# Patient Record
Sex: Female | Born: 2000 | Race: Black or African American | Hispanic: No | Marital: Single | Smoking: Never smoker
Health system: Southern US, Community
[De-identification: ages and names within clinical notes are randomized; demographics above are authoritative.]

---
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
## 2018-01-04 ENCOUNTER — Telehealth: Payer: Self-pay | Admitting: Nurse Practitioner

## 2018-01-04 NOTE — Telephone Encounter (Unsigned)
Copied from CRM [phone]. Topic: Quick Communication - See Telephone Encounter >> Jan 04, 2018  2:56 PM Raquel SarnaHayes, Teresa G wrote: Pt needs a tetanus shot for college.  Please order shot and schedule appt.

## 2018-01-04 NOTE — Telephone Encounter (Signed)
Appointment scheduled.

## 2018-01-04 NOTE — Telephone Encounter (Signed)
Yes she just needs a nurse visit.

## 2018-01-04 NOTE — Telephone Encounter (Signed)
Is it okay to schedule appointment for injection?

## 2018-01-07 ENCOUNTER — Ambulatory Visit (INDEPENDENT_AMBULATORY_CARE_PROVIDER_SITE_OTHER): Payer: BC Managed Care – PPO | Admitting: *Deleted

## 2018-01-07 ENCOUNTER — Encounter: Payer: Self-pay | Admitting: Obstetrics & Gynecology

## 2018-01-07 DIAGNOSIS — Z23 Encounter for immunization: Secondary | ICD-10-CM | POA: Diagnosis not present

## 2018-01-08 ENCOUNTER — Ambulatory Visit (INDEPENDENT_AMBULATORY_CARE_PROVIDER_SITE_OTHER): Payer: BC Managed Care – PPO | Admitting: Anesthesiology

## 2018-01-08 DIAGNOSIS — Z23 Encounter for immunization: Secondary | ICD-10-CM

## 2018-10-22 ENCOUNTER — Encounter: Payer: Self-pay | Admitting: Internal Medicine

## 2018-10-22 ENCOUNTER — Ambulatory Visit (INDEPENDENT_AMBULATORY_CARE_PROVIDER_SITE_OTHER): Payer: BC Managed Care – PPO | Admitting: Internal Medicine

## 2018-10-22 ENCOUNTER — Other Ambulatory Visit: Payer: Self-pay

## 2018-10-22 ENCOUNTER — Telehealth: Payer: Self-pay | Admitting: Nurse Practitioner

## 2018-10-22 DIAGNOSIS — J301 Allergic rhinitis due to pollen: Secondary | ICD-10-CM | POA: Diagnosis not present

## 2018-10-22 DIAGNOSIS — J309 Allergic rhinitis, unspecified: Secondary | ICD-10-CM | POA: Insufficient documentation

## 2018-10-22 MED ORDER — OLOPATADINE HCL 0.2 % OP SOLN: 1.0000 [drp] | Freq: Every day | OPHTHALMIC | 0 refills | Status: AC

## 2018-10-22 MED ORDER — FLUTICASONE PROPIONATE 50 MCG/ACT NA SUSP: 2.0000 | Freq: Every day | NASAL | 6 refills | Status: DC

## 2018-10-22 NOTE — Progress Notes (Signed)
Virtual Visit via Video Note  I connected with Sabrina Huber on 10/22/18 at  1:40 PM EDT by a video enabled telemedicine application and verified that I am speaking with the correct person using two identifiers.   I discussed the limitations of evaluation and management by telemedicine and the availability of in person appointments. The patient expressed understanding and agreed to proceed.  History of Present Illness: The patient is a 18 y.o. YO female with visit for allergy symptoms including itchy throat and nose and eyes. Started about 1 week or so ago. Typically gets seasonal allergy symptoms and used to take zyrtec which stopped working so she switched to [employer]. This worked well for a couple of weeks but stopped working last week. Having eye itchiness and puffiness. Some nose drainage clear. Denies ear pain or sinus pressure/pain. Denies fevers or chills or body aches. Has still been taking allegra. Denies SOB or cough. Overall it is stable since onset. LMP within last month exact date unknown  Observations/Objective: Appearance: normal, breathing appears normal, normal grooming, abdomen does not appear distended, throat normal, mental status is A and O times 3  Assessment and Plan: See problem oriented charting  Follow Up Instructions: Rx for flonase and pataday eye drops, call back if not improved  I discussed the assessment and treatment plan with the patient. The patient was provided an opportunity to ask questions and all were answered. The patient agreed with the plan and demonstrated an understanding of the instructions.   The patient was advised to call back or seek an in-person evaluation if the symptoms worsen or if the condition fails to improve as anticipated.  Myrlene Broker, MD

## 2018-10-22 NOTE — Telephone Encounter (Signed)
Copied from CRM [id number]. Topic: Quick Communication - Rx Refill/Question >> Oct 22, 2018 12:07 PM Baldo Daub L wrote: Medication:  Allergy pill  Pt called and left message on [facility] mailbox.  States that OTC allergy medication is not working and she would like doctor to call something in for her.  Pt states she can be reached at [phone].

## 2018-10-22 NOTE — Assessment & Plan Note (Signed)
Rx for flonase and pataday for symptoms. No suspicion for covid-19 and discussed signs/symptoms to watch for and to maintain social distancing and importance although she does not have symptoms at this time.

## 2018-10-22 NOTE — Telephone Encounter (Signed)
Patient scheduled with Okey Dupre.

## 2018-10-22 NOTE — Telephone Encounter (Signed)
Please advise 

## 2019-03-13 ENCOUNTER — Other Ambulatory Visit: Payer: Self-pay

## 2019-03-13 DIAGNOSIS — Z20822 Contact with and (suspected) exposure to covid-19: Secondary | ICD-10-CM

## 2019-03-15 LAB — NOVEL CORONAVIRUS, NAA: SARS-CoV-2, NAA: NOT DETECTED

## 2019-04-14 ENCOUNTER — Encounter: Payer: Self-pay | Admitting: Gynecology

## 2019-06-11 ENCOUNTER — Other Ambulatory Visit: Payer: BC Managed Care – PPO

## 2019-06-11 ENCOUNTER — Other Ambulatory Visit: Payer: Self-pay

## 2019-06-11 ENCOUNTER — Ambulatory Visit (INDEPENDENT_AMBULATORY_CARE_PROVIDER_SITE_OTHER): Payer: BC Managed Care – PPO | Admitting: Internal Medicine

## 2019-06-11 ENCOUNTER — Encounter: Payer: Self-pay | Admitting: Internal Medicine

## 2019-06-11 VITALS — BP 118/78 | HR 84 | Temp 99.2°F | Ht 66.06 in | Wt 235.0 lb

## 2019-06-11 DIAGNOSIS — Z202 Contact with and (suspected) exposure to infections with a predominantly sexual mode of transmission: Secondary | ICD-10-CM

## 2019-06-11 DIAGNOSIS — F3281 Premenstrual dysphoric disorder: Secondary | ICD-10-CM | POA: Diagnosis not present

## 2019-06-11 NOTE — Patient Instructions (Signed)

## 2019-06-11 NOTE — Progress Notes (Signed)
   Subjective:   Patient ID: Sabrina Huber, female    DOB: [birth year], 18 y.o.   MRN: [id number]  HPI The patient is a 18 YO female coming in for concerns about PMS symptoms including bad cramping, bleeding for 1-2 weeks, irregular periods, mood disturbance. This has been going on in the last year or so. She would like to be checked for STDs. No recent exposure but concerns about previous partner. She has not been on birth control except for depo-provera which caused her to stop having periods for months after only 1 dose and she did not have that again. She does have a gynecologist and sees them regularly.   Review of Systems  Constitutional: Negative.   HENT: Negative.   Eyes: Negative.   Respiratory: Negative for cough, chest tightness and shortness of breath.   Cardiovascular: Negative for chest pain, palpitations and leg swelling.  Gastrointestinal: Negative for abdominal distention, abdominal pain, constipation, diarrhea, nausea and vomiting.  Genitourinary: Positive for menstrual problem.  Musculoskeletal: Negative.   Skin: Negative.   Neurological: Negative.   Psychiatric/Behavioral: Negative.     Objective:  Physical Exam Constitutional:      Appearance: She is well-developed.  HENT:     Head: Normocephalic and atraumatic.  Neck:     Musculoskeletal: Normal range of motion.  Cardiovascular:     Rate and Rhythm: Normal rate and regular rhythm.  Pulmonary:     Effort: Pulmonary effort is normal. No respiratory distress.     Breath sounds: Normal breath sounds. No wheezing or rales.  Abdominal:     General: Bowel sounds are normal. There is no distension.     Palpations: Abdomen is soft.     Tenderness: There is no abdominal tenderness. There is no rebound.  Skin:    General: Skin is warm and dry.  Neurological:     Mental Status: She is alert and oriented to person, place, and time.     Coordination: Coordination normal.     Vitals:   06/11/19 0821  BP: 118/78   Pulse: 84  Temp: 99.2 F (37.3 C)  TempSrc: Oral  SpO2: 99%  Weight: 235 lb (106.6 kg)  Height: 5' 6.06" (1.678 m)    Assessment & Plan:

## 2019-06-12 DIAGNOSIS — F3281 Premenstrual dysphoric disorder: Secondary | ICD-10-CM | POA: Insufficient documentation

## 2019-06-12 LAB — HIV ANTIBODY (ROUTINE TESTING W REFLEX): HIV 1&2 Ab, 4th Generation: NONREACTIVE

## 2019-06-12 NOTE — Assessment & Plan Note (Signed)
Checking for STDs, counseled about birth control options. Advised her to see her gyn given the change in bleeding to ensure no problems.

## 2019-06-15 LAB — GC/CHLAMYDIA PROBE AMP
Chlamydia trachomatis, NAA: NEGATIVE
Neisseria Gonorrhoeae by PCR: NEGATIVE

## 2019-06-19 ENCOUNTER — Encounter: Payer: Self-pay | Admitting: Internal Medicine

## 2019-08-21 ENCOUNTER — Ambulatory Visit: Payer: BC Managed Care – PPO | Attending: Internal Medicine

## 2019-08-21 DIAGNOSIS — Z20822 Contact with and (suspected) exposure to covid-19: Secondary | ICD-10-CM

## 2019-08-22 LAB — NOVEL CORONAVIRUS, NAA: SARS-CoV-2, NAA: NOT DETECTED

## 2019-10-30 ENCOUNTER — Ambulatory Visit: Payer: BC Managed Care – PPO

## 2019-12-26 ENCOUNTER — Telehealth (INDEPENDENT_AMBULATORY_CARE_PROVIDER_SITE_OTHER): Payer: BC Managed Care – PPO | Admitting: Physician Assistant

## 2019-12-26 ENCOUNTER — Encounter: Payer: Self-pay | Admitting: Physician Assistant

## 2019-12-26 DIAGNOSIS — R0981 Nasal congestion: Secondary | ICD-10-CM

## 2019-12-26 MED ORDER — AMOXICILLIN-POT CLAVULANATE 875-125 MG PO TABS
1.0000 | ORAL_TABLET | Freq: Two times a day (BID) | ORAL | 0 refills | Status: DC
Start: 2019-12-26 — End: 2021-02-23

## 2019-12-26 MED ORDER — FLUTICASONE PROPIONATE 50 MCG/ACT NA SUSP: 2.0000 | Freq: Every day | NASAL | 6 refills | Status: DC

## 2019-12-26 NOTE — Progress Notes (Signed)
Virtual Visit via Video   I connected with Sabrina Huber on 12/26/19 at 12:30 PM EDT by a video enabled telemedicine application and verified that I am speaking with the correct person using two identifiers. Location patient: Home Location provider: [facility], Office Persons participating in the virtual visit: Sibyl Addelynn, Batte PA-C.  I discussed the limitations of evaluation and management by telemedicine and the availability of in person appointments. The patient expressed understanding and agreed to proceed.   Subjective:   HPI:   Sinus problem Reports sinus congestion x 3 days. Mainly frontal. Has history of allergic rhinitis and sinusitis -- typically gets a sinus infection this time, yearly.  Has not tried anything other than OTC flonase for symptoms. Doesn't take regular antihistamines because she feels like they do not work for her.  Denies: fevers, chills, cough, SOB, chest pain  ROS: See pertinent positives and negatives per HPI.  Patient Active Problem List   Diagnosis Date Noted  . PMDD (premenstrual dysphoric disorder) 06/12/2019  . Allergic rhinitis 10/22/2018  . Encounter for general adult medical examination with abnormal findings 08/29/2017    Social History   Tobacco Use  . Smoking status: Never Smoker  . Smokeless tobacco: Never Used  Substance Use Topics  . Alcohol use: No    Current Outpatient Medications:  .  amoxicillin-clavulanate (AUGMENTIN) 875-125 MG tablet, Take 1 tablet by mouth 2 (two) times daily., Disp: 20 tablet, Rfl: 0 .  fluticasone (FLONASE) 50 MCG/ACT nasal spray, Place 2 sprays into both nostrils daily., Disp: 16 g, Rfl: 6 .  Olopatadine HCl 0.2 % SOLN, Apply 1-2 drops to eye daily., Disp: 5 mL, Rfl: 0  No Known Allergies  Objective:   VITALS: Per patient if applicable, see vitals. GENERAL: Alert, appears well and in no acute distress. HEENT: Atraumatic, conjunctiva clear, no obvious abnormalities on  inspection of external nose and ears. NECK: Normal movements of the head and neck. CARDIOPULMONARY: No increased WOB. Speaking in clear sentences. I:E ratio WNL.  MS: Moves all visible extremities without noticeable abnormality. PSYCH: Pleasant and cooperative, well-groomed. Speech normal rate and rhythm. Affect is appropriate. Insight and judgement are appropriate. Attention is focused, linear, and appropriate.  NEURO: CN grossly intact. Oriented as arrived to appointment on time with no prompting. Moves both UE equally.  SKIN: No obvious lesions, wounds, erythema, or cyanosis noted on face or hands.  Assessment and Plan:   Sabrina Huber was seen today for sinus problem.  Diagnoses and all orders for this visit:  Sinus congestion No red flags on discussion exam. Suspect early sinusitis. Recommend flonase use and antihistamine of choice. Did provide pocket rx of oral augmentin should symptoms persist despite 7-10 days or worsen. Discussed taking medications as prescribed. Reviewed return precautions including worsening fever, SOB, worsening cough or other concerns. Push fluids and rest. I recommend that patient follow-up if symptoms worsen or persist despite treatment x 7-10 days, sooner if needed.  Other orders -     amoxicillin-clavulanate (AUGMENTIN) 875-125 MG tablet; Take 1 tablet by mouth 2 (two) times daily. -     fluticasone (FLONASE) 50 MCG/ACT nasal spray; Place 2 sprays into both nostrils daily.  . Reviewed expectations re: course of current medical issues. . Discussed self-management of symptoms. . Outlined signs and symptoms indicating need for more acute intervention. . Patient verbalized understanding and all questions were answered. Marland Kitchen Health Maintenance issues including appropriate healthy diet, exercise, and smoking avoidance were discussed with patient. . See orders  for this visit as documented in the electronic medical record.  I discussed the assessment and treatment plan  with the patient. The patient was provided an opportunity to ask questions and all were answered. The patient agreed with the plan and demonstrated an understanding of the instructions.   The patient was advised to call back or seek an in-person evaluation if the symptoms worsen or if the condition fails to improve as anticipated.  [place], [place] 12/26/2019

## 2020-08-31 ENCOUNTER — Encounter: Payer: Self-pay | Admitting: Family Medicine

## 2020-08-31 ENCOUNTER — Telehealth: Payer: 59 | Admitting: Family Medicine

## 2020-08-31 DIAGNOSIS — B373 Candidiasis of vulva and vagina: Secondary | ICD-10-CM

## 2020-08-31 DIAGNOSIS — B3731 Acute candidiasis of vulva and vagina: Secondary | ICD-10-CM

## 2020-08-31 MED ORDER — FLUCONAZOLE 150 MG PO TABS: 150.0000 mg | ORAL_TABLET | Freq: Every day | ORAL | 0 refills | Status: DC

## 2020-08-31 NOTE — Patient Instructions (Signed)
I appreciate the opportunity to provide you with care for your health and wellness.  Follow up: w PCP if not improved  Please continue to practice social distancing to keep you, your family, and our community safe.  If you must go out, please wear a mask and practice good handwashing.   Vaginal Yeast Infection, Adult  Vaginal yeast infection is a condition that causes vaginal discharge as well as soreness, swelling, and redness (inflammation) of the vagina. This is a common condition. Some women get this infection frequently. What are the causes? This condition is caused by a change in the normal balance of the yeast (candida) and bacteria that live in the vagina. This change causes an overgrowth of yeast, which causes the inflammation. What increases the risk? The condition is more likely to develop in women who:  Take antibiotic medicines.  Have diabetes.  Take birth control pills.  Are pregnant.  Douche often.  Have a weak body defense system (immune system).  Have been taking steroid medicines for a long time.  Frequently wear tight clothing. What are the signs or symptoms? Symptoms of this condition include:  White, thick, creamy vaginal discharge.  Swelling, itching, redness, and irritation of the vagina. The lips of the vagina (vulva) may be affected as well.  Pain or a burning feeling while urinating.  Pain during sex. How is this diagnosed? This condition is diagnosed based on:  Your medical history.  A physical exam.  A pelvic exam. Your health care provider will examine a sample of your vaginal discharge under a microscope. Your health care provider may send this sample for testing to confirm the diagnosis. How is this treated? This condition is treated with medicine. Medicines may be over-the-counter or prescription. You may be told to use one or more of the following:  Medicine that is taken by mouth (orally).  Medicine that is applied as a cream  (topically).  Medicine that is inserted directly into the vagina (suppository). Follow these instructions at home: Lifestyle  Do not have sex until your health care provider approves. Tell your sex partner that you have a yeast infection. That person should go to his or her health care provider and ask if they should also be treated.  Do not wear tight clothes, such as pantyhose or tight pants.  Wear breathable cotton underwear. General instructions  Take or apply over-the-counter and prescription medicines only as told by your health care provider.  Eat more yogurt. This may help to keep your yeast infection from returning.  Do not use tampons until your health care provider approves.  Try taking a sitz bath to help with discomfort. This is a warm water bath that is taken while you are sitting down. The water should only come up to your hips and should cover your buttocks. Do this 3-4 times per day or as told by your health care provider.  Do not douche.  If you have diabetes, keep your blood sugar levels under control.  Keep all follow-up visits as told by your health care provider. This is important.   Contact a health care provider if:  You have a fever.  Your symptoms go away and then return.  Your symptoms do not get better with treatment.  Your symptoms get worse.  You have new symptoms.  You develop blisters in or around your vagina.  You have blood coming from your vagina and it is not your menstrual period.  You develop pain in your abdomen.  Summary  Vaginal yeast infection is a condition that causes discharge as well as soreness, swelling, and redness (inflammation) of the vagina.  This condition is treated with medicine. Medicines may be over-the-counter or prescription.  Take or apply over-the-counter and prescription medicines only as told by your health care provider.  Do not douche. Do not have sex or use tampons until your health care provider  approves.  Contact a health care provider if your symptoms do not get better with treatment or your symptoms go away and then return. This information is not intended to replace advice given to you by your health care provider. Make sure you discuss any questions you have with your health care provider. Document Revised: 02/07/2019 Document Reviewed: 11/26/2017 Elsevier Patient Education  2021 [employer].

## 2020-08-31 NOTE — Progress Notes (Signed)
Ms. fusae, florio are scheduled for a virtual visit with your provider today.    Just as we do with appointments in the office, we must obtain your consent to participate.  Your consent will be active for this visit and any virtual visit you may have with one of our providers in the next 365 days.    If you have a MyChart account, I can also send a copy of this consent to you electronically.  All virtual visits are billed to your insurance company just like a traditional visit in the office.  As this is a virtual visit, video technology does not allow for your provider to perform a traditional examination.  This may limit your provider's ability to fully assess your condition.  If your provider identifies any concerns that need to be evaluated in person or the need to arrange testing such as labs, EKG, etc, we will make arrangements to do so.    Although advances in technology are sophisticated, we cannot ensure that it will always work on either your end or our end.  If the connection with a video visit is poor, we may have to switch to a telephone visit.  With either a video or telephone visit, we are not always able to ensure that we have a secure connection.   I need to obtain your verbal consent now.   Are you willing to proceed with your visit today?   Aala Ransom has provided verbal consent on 08/31/2020 for a virtual visit (video or telephone).   Freddy Finner, NP 08/31/2020  8:45 AM   Date:  08/31/2020   ID:  Roosvelt Maser, DOB 02/04/2001, MRN [id number]  Patient Location: Home Provider Location: Home Office   Participants: Patient and Provider for Visit and Wrap up  Method of visit: Video  Location of Patient: Home Location of Provider: Home Office Consent was obtain for visit over the video. Services rendered by provider: Visit was performed via video  A video enabled telemedicine application was used and I verified that I am speaking with the correct person using two  identifiers.  PCP:  Evaristo Bury, NP   Chief Complaint:  Vaginal itching   History of Present Illness:    Shantele Reller is a 20 y.o. female with history as stated below. Presents video telehealth for an acute care visit due to vaginal itching. She reports over a week of increase vaginal itching. She feels her skin is tender and irritated. She has white clumpy discharge which she reports to be a yeast like infection- has had one in the past. Has tried monistat without relief. She thinks it is related to a new body wash, as it did not start until after that. She denies recent intercourse in last 2-4 weeks or exposure to STI's  She denies having any other issues or concerns.  Past Medical, Surgical, Social History, Allergies, and Medications have been Reviewed.  No past medical history on file.  Current Meds  Medication Sig  . fluconazole (DIFLUCAN) 150 MG tablet Take 1 tablet (150 mg total) by mouth daily.     Allergies:   Patient has no known allergies.   ROS See HPI for history of present illness.  Physical Exam Constitutional:      Appearance: Normal appearance.  HENT:     Head: Normocephalic and atraumatic.     Right Ear: External ear normal.     Left Ear: External ear normal.     Nose: Nose normal.  Eyes:     Extraocular Movements: Extraocular movements intact.     Conjunctiva/sclera: Conjunctivae normal.     Pupils: Pupils are equal, round, and reactive to light.  Pulmonary:     Comments: No shortness of breath in conversation  Musculoskeletal:        General: Normal range of motion.     Cervical back: Normal range of motion.  Skin:    Coloration: Skin is not jaundiced or pale.  Neurological:     Mental Status: She is alert and oriented to person, place, and time.  Psychiatric:        Mood and Affect: Mood normal.        Behavior: Behavior normal.        Thought Content: Thought content normal.        Judgment: Judgment normal.              1.  Vaginal yeast infection S&S are consistent with a yeast infection, however I did mention BV being a concern. She reports if treatment does not help she will call PCP to have an in person appt.  -Provided with Diflucan x 1 -Encouraged to avoid the body wash, bubble bathes and other hygiene products to that area -She is encourage to eat yogurt to provide healthy bacteria growth -Additional info on AVS   - fluconazole (DIFLUCAN) 150 MG tablet; Take 1 tablet (150 mg total) by mouth daily.  Dispense: 1 tablet; Refill: 0    Time:   Today, I have spent 10 minutes with the patient with telehealth technology discussing the above problems, reviewing the chart, previous notes, medications and orders.    Tests Ordered: No orders of the defined types were placed in this encounter.   Medication Changes: Meds ordered this encounter  Medications  . fluconazole (DIFLUCAN) 150 MG tablet    Sig: Take 1 tablet (150 mg total) by mouth daily.    Dispense:  1 tablet    Refill:  0    Order Specific Question:   Supervising Provider    Answer:   Kerri Perches [[id number]]     Disposition:  Follow up w PCP Signed, Freddy Finner, NP  08/31/2020 8:45 AM

## 2021-02-23 ENCOUNTER — Encounter (HOSPITAL_COMMUNITY): Payer: Self-pay

## 2021-02-23 ENCOUNTER — Ambulatory Visit (HOSPITAL_COMMUNITY)
Admission: EM | Admit: 2021-02-23 | Discharge: 2021-02-23 | Disposition: A | Discharge status: Home / Self Care | Payer: 59 | Attending: Medical Oncology | Admitting: Medical Oncology

## 2021-02-23 ENCOUNTER — Other Ambulatory Visit: Payer: Self-pay

## 2021-02-23 DIAGNOSIS — J301 Allergic rhinitis due to pollen: Secondary | ICD-10-CM

## 2021-02-23 MED ORDER — FLUTICASONE PROPIONATE 50 MCG/ACT NA SUSP: 2.0000 | Freq: Every day | NASAL | 0 refills | Status: AC

## 2021-02-23 NOTE — ED Triage Notes (Signed)
Pt reports on and off nasal congestion x 5 days.

## 2021-02-23 NOTE — ED Provider Notes (Signed)
[facility]    CSN: [id number] Arrival date & time: 02/23/21  1521      History   Chief Complaint Chief Complaint  Patient presents with   Nasal Congestion    HPI Sabrina Huber is a 20 y.o. female.   HPI  Nasal Congestion: Patient reports that about once a year she gets seasonal allergies.  Seasonal allergies comprised of a stuffy and runny nose.  She has had symptoms for the last few days.  She reports that normally she takes Flonase but has been unable to get into see her PCP so she arrives today.  She denies any fevers, cough, sore throat, vomiting or diarrhea.  She has tried Mucinex but this did not help much.  No known sick contacts.  History reviewed. No pertinent past medical history.  Patient Active Problem List   Diagnosis Date Noted   PMDD (premenstrual dysphoric disorder) 06/12/2019   Allergic rhinitis 10/22/2018   Encounter for general adult medical examination with abnormal findings 08/29/2017    History reviewed. No pertinent surgical history.  OB History     Gravida  0   Para  0   Term  0   Preterm  0   AB  0   Living  0      SAB  0   IAB  0   Ectopic  0   Multiple  0   Live Births  0            Home Medications    Prior to Admission medications   Medication Sig Start Date End Date Taking? Authorizing Provider  amoxicillin-clavulanate (AUGMENTIN) 875-125 MG tablet Take 1 tablet by mouth 2 (two) times daily. 12/26/19   Jarold Motto, PA  fluconazole (DIFLUCAN) 150 MG tablet Take 1 tablet (150 mg total) by mouth daily. 08/31/20   Freddy Finner, NP  fluticasone (FLONASE) 50 MCG/ACT nasal spray Place 2 sprays into both nostrils daily. 12/26/19   Jarold Motto, PA  Olopatadine HCl 0.2 % SOLN Apply 1-2 drops to eye daily. 10/22/18   Myrlene Broker, MD    Family History Family History  Problem Relation Age of Onset   Hyperlipidemia Maternal Uncle     Social History Social History   Tobacco Use   Smoking  status: Never   Smokeless tobacco: Never  Vaping Use   Vaping Use: Never used  Substance Use Topics   Alcohol use: No   Drug use: No     Allergies   Patient has no known allergies.   Review of Systems Review of Systems  As stated above in HPI Physical Exam Triage Vital Signs ED Triage Vitals  Enc Vitals Group     BP 02/23/21 1740 124/75     Pulse Rate 02/23/21 1740 83     Resp 02/23/21 1740 18     Temp 02/23/21 1740 98.7 F (37.1 C)     Temp Source 02/23/21 1740 Oral     SpO2 02/23/21 1740 100 %     Weight --      Height --      Head Circumference --      Peak Flow --      Pain Score 02/23/21 1739 0     Pain Loc --      Pain Edu? --      Excl. in GC? --    No data found.  Updated Vital Signs BP 124/75 (BP Location: Right Arm)   Pulse 83  Temp 98.7 F (37.1 C) (Oral)   Resp 18   SpO2 100%   Physical Exam Vitals and nursing note reviewed.  Constitutional:      General: She is not in acute distress.    Appearance: Normal appearance. She is not ill-appearing, toxic-appearing or diaphoretic.  HENT:     Head: Normocephalic and atraumatic.     Right Ear: Tympanic membrane normal.     Left Ear: Tympanic membrane normal.     Nose: Congestion and rhinorrhea present.     Mouth/Throat:     Mouth: Mucous membranes are moist.     Pharynx: Oropharynx is clear. No oropharyngeal exudate or posterior oropharyngeal erythema.  Eyes:     Extraocular Movements: Extraocular movements intact.     Pupils: Pupils are equal, round, and reactive to light.  Cardiovascular:     Rate and Rhythm: Normal rate and regular rhythm.     Heart sounds: Normal heart sounds.  Pulmonary:     Effort: Pulmonary effort is normal.     Breath sounds: Normal breath sounds.  Musculoskeletal:     Cervical back: Neck supple.  Lymphadenopathy:     Cervical: No cervical adenopathy.  Skin:    General: Skin is warm.  Neurological:     Mental Status: She is alert and oriented to person, place,  and time.     UC Treatments / Results  Labs (all labs ordered are listed, but only abnormal results are displayed) Labs Reviewed - No data to display  EKG   Radiology No results found.  Procedures Procedures (including critical care time)  Medications Ordered in UC Medications - No data to display  Initial Impression / Assessment and Plan / UC Course  I have reviewed the triage vital signs and the nursing notes.  Pertinent labs & imaging results that were available during my care of the patient were reviewed by me and considered in my medical decision making (see chart for details).     New.  Sending in Flonase to help with her allergic rhinitis.  Follow-up as needed. Final Clinical Impressions(s) / UC Diagnoses   Final diagnoses:  None   Discharge Instructions   None    ED Prescriptions   None    PDMP not reviewed this encounter.   Rushie Chestnut, [place] 02/23/21 1752

## 2021-03-03 ENCOUNTER — Ambulatory Visit: Payer: 59 | Admitting: Physician Assistant

## 2021-05-31 ENCOUNTER — Other Ambulatory Visit: Payer: Self-pay

## 2021-05-31 ENCOUNTER — Encounter: Payer: Self-pay | Admitting: Internal Medicine

## 2021-05-31 ENCOUNTER — Ambulatory Visit: Payer: 59 | Admitting: Internal Medicine

## 2021-05-31 VITALS — BP 118/76 | HR 71 | Temp 98.7°F | Ht 66.0 in | Wt 169.0 lb

## 2021-05-31 DIAGNOSIS — Z23 Encounter for immunization: Secondary | ICD-10-CM | POA: Diagnosis not present

## 2021-05-31 DIAGNOSIS — Z124 Encounter for screening for malignant neoplasm of cervix: Secondary | ICD-10-CM

## 2021-05-31 DIAGNOSIS — R634 Abnormal weight loss: Secondary | ICD-10-CM | POA: Diagnosis not present

## 2021-05-31 DIAGNOSIS — F320 Major depressive disorder, single episode, mild: Secondary | ICD-10-CM

## 2021-05-31 DIAGNOSIS — Z0001 Encounter for general adult medical examination with abnormal findings: Secondary | ICD-10-CM

## 2021-05-31 LAB — CBC WITH DIFFERENTIAL/PLATELET
Basophils Absolute: 0 10*3/uL (ref 0.0–0.1)
Basophils Relative: 1.2 % (ref 0.0–3.0)
Eosinophils Absolute: 0.1 10*3/uL (ref 0.0–0.7)
Eosinophils Relative: 1.3 % (ref 0.0–5.0)
HCT: 37.5 % (ref 36.0–46.0)
Hemoglobin: 12.4 g/dL (ref 12.0–15.0)
Lymphocytes Relative: 26.4 % (ref 12.0–46.0)
Lymphs Abs: 1.1 10*3/uL (ref 0.7–4.0)
MCHC: 33 g/dL (ref 30.0–36.0)
MCV: 94.7 fl (ref 78.0–100.0)
Monocytes Absolute: 0.4 10*3/uL (ref 0.1–1.0)
Monocytes Relative: 10.2 % (ref 3.0–12.0)
Neutro Abs: 2.5 10*3/uL (ref 1.4–7.7)
Neutrophils Relative %: 60.9 % (ref 43.0–77.0)
Platelets: 237 10*3/uL (ref 150.0–400.0)
RBC: 3.96 Mil/uL (ref 3.87–5.11)
RDW: 13.1 % (ref 11.5–15.5)
WBC: 4.2 10*3/uL (ref 4.0–10.5)

## 2021-05-31 LAB — BASIC METABOLIC PANEL
BUN: 10 mg/dL (ref 6–23)
CO2: 25 mEq/L (ref 19–32)
Calcium: 9 mg/dL (ref 8.4–10.5)
Chloride: 106 mEq/L (ref 96–112)
Creatinine, Ser: 0.73 mg/dL (ref 0.40–1.20)
GFR: 117.11 mL/min (ref >60.00)
Glucose, Bld: 84 mg/dL (ref 70–99)
Potassium: 3.7 mEq/L (ref 3.5–5.1)
Sodium: 139 mEq/L (ref 135–145)

## 2021-05-31 LAB — HEPATIC FUNCTION PANEL
ALT: 8 U/L (ref 0–35)
AST: 14 U/L (ref 0–37)
Albumin: 4.1 g/dL (ref 3.5–5.2)
Alkaline Phosphatase: 33 U/L — ABNORMAL LOW (ref 39–117)
Bilirubin, Direct: 0.1 mg/dL (ref 0.0–0.3)
Total Bilirubin: 0.4 mg/dL (ref 0.2–1.2)
Total Protein: 7 g/dL (ref 6.0–8.3)
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
## 2021-05-31 NOTE — Patient Instructions (Signed)
[facility] Lorenda Cahill [phone]  Major Depressive Disorder, Adult Major depressive disorder (MDD) is a mental health condition. It may also be called clinical depression or unipolar depression. MDD causes symptoms of sadness, hopelessness, and loss of interest in things. These symptoms last most of the day, almost every day, for 2 weeks. MDD can also cause physical symptoms. It can interfere with relationships and with everyday activities, such as work, school, and activities that are usually pleasant. MDD may be mild, moderate, or severe. It may be single-episode MDD, which happens once, or recurrent MDD, which may occur multiple times. What are the causes? The exact cause of this condition is not known. MDD is most likely caused by a combination of things, which may include: Your personality traits. Learned or conditioned behaviors or thoughts or feelings that reinforce negativity. Any alcohol or substance misuse. Long-term (chronic) physical or mental health illness. Going through a traumatic experience or major life changes. What increases the risk? The following factors may make someone more likely to develop MDD: A family history of depression. Being a woman. Troubled family relationships. Abnormally low levels of certain brain chemicals. Traumatic or painful events in childhood, especially abuse or loss of a parent. A lot of stress from life experiences, such as poor living conditions or discrimination. Chronic physical illness or other mental health disorders. What are the signs or symptoms? The main symptoms of MDD usually include: Constant depressed or irritable mood. A loss of interest in things and activities. Other symptoms include: Sleeping or eating too much or too little. Unexplained weight gain or weight loss. Tiredness or low energy. Being agitated, restless, or weak. Feeling hopeless, worthless, or guilty. Trouble thinking clearly or making  decisions. Thoughts of suicide or thoughts of harming others. Isolating oneself or avoiding other people or activities. Trouble completing tasks, work, or any normal obligations. Severe symptoms of this condition may include: Psychotic depression.This may include false beliefs, or delusions. It may also include seeing, hearing, tasting, smelling, or feeling things that are not real (hallucinations). Chronic depression or persistent depressive disorder. This is low-level depression that lasts for at least 2 years. Melancholic depression, or feeling extremely sad and hopeless. Catatonic depression, which includes trouble speaking and trouble moving. How is this diagnosed? This condition may be diagnosed based on: Your symptoms. Your medical and mental health history. You may be asked questions about your lifestyle, including any drug and alcohol use. A physical exam. Blood tests to rule out other conditions. MDD is confirmed if you have the following symptoms most of the day, nearly every day, in a 2-week period: Either a depressed mood or loss of interest. At least four other MDD symptoms. How is this treated? This condition is usually treated by mental health professionals, such as psychologists, psychiatrists, and clinical social workers. You may need more than one type of treatment. Treatment may include: Psychotherapy, also called talk therapy or counseling. Types of psychotherapy include: Cognitive behavioral therapy (CBT). This teaches you to recognize unhealthy feelings, thoughts, and behaviors, and replace them with positive thoughts and actions. Interpersonal therapy (IPT). This helps you to improve the way you communicate with others or relate to them. Family therapy. This treatment includes members of your family. Medicines to treat anxiety and depression. These medicines help to balance the brain chemicals that affect your emotions. Lifestyle changes. You may be asked to: Limit  alcohol use and avoid drug use. Get regular exercise. Get plenty of sleep. Make healthy eating choices. Spend  Spend more time outdoors. ?Brain stimulation. This may be done if symptoms are very severe and other treatments have not worked. Examples of this treatment are electroconvulsive therapy and transcranial magnetic stimulation. ?Follow these instructions at home: ?Activity ?Exercise regularly and spend time outdoors. ?Find activities that you enjoy doing, and make time to do them. ?Find healthy ways to manage stress, such as: ?Meditation or deep breathing. ?Spending time in nature. ?Journaling. ?Return to your normal activities as told by your health care provider. Ask your health care provider what activities are safe for you. ?Alcohol and drug use ?If you drink alcohol: ?Limit how much you use to: ?0-1 drink a day for women who are not pregnant. ?0-2 drinks a day for men. ?Be aware of how much alcohol is in your drink. In the [place] one drink equals one 12 oz bottle of beer (355 mL), one 5 oz glass of wine (148 mL), or one 1? oz glass of hard liquor (44 mL). ?Discuss your alcohol use with your health care provider. Alcohol can affect any antidepressant medicines you are taking. ?Discuss any drug use with your health care provider. ?General instructions ? ?Take over-the-counter and prescription medicines only as told by your health care provider. ?Eat a healthy diet and get plenty of sleep. ?Consider joining a support group. Your health care provider may be able to recommend one. ?Keep all follow-up visits as told by your health care provider. This is important. ?Where to find more information ?National Alliance on Mental Illness: www.nami.org ?[place] National Institute of Mental Health: www.nimh.nih.gov ?Contact a health care provider if: ?Your symptoms get worse. ?You develop new symptoms. ?Get help right away if: ?You self-harm. ?You have serious thoughts about hurting yourself or others. ?You hallucinate. ?If  you ever feel like you may hurt yourself or others, or have thoughts about taking your own life, get help right away. Go to your nearest emergency department or: ?Call your local emergency services (911 in the [place] ?Call a suicide crisis helpline, such as the National Suicide Prevention Lifeline at 1-800-273-8255 or 988 in the [place] This is open 24 hours a day in the [place] ?Text the Crisis Text Line at 741741 (in the [place] ?Summary ?Major depressive disorder (MDD) is a mental health condition. MDD causes symptoms of sadness, hopelessness, and loss of interest in things. These symptoms last most of the day, almost every day, for 2 weeks. ?The symptoms of MDD can interfere with relationships and with everyday activities. ?Treatments and support are available for people who develop MDD. You may need more than one type of treatment. ?Get help right away if you have serious thoughts about hurting yourself or others. ?This information is not intended to replace advice given to you by your health care provider. Make sure you discuss any questions you have with your health care provider. ?Document Revised: 02/02/2021 Document Reviewed: 06/21/2019 ?Elsevier Patient Education ? 2022 Elsevier Inc. ? ?

## 2021-06-01 LAB — THYROID PANEL WITH TSH
Free Thyroxine Index: 1.6 (ref 1.4–3.8)
T3 Uptake: 27 % (ref 22–35)
T4, Total: 6.1 ug/dL (ref 5.1–11.9)
TSH: 0.86 mIU/L

## 2021-06-01 LAB — HIV ANTIBODY (ROUTINE TESTING W REFLEX): HIV 1&2 Ab, 4th Generation: NONREACTIVE

## 2021-06-03 ENCOUNTER — Encounter: Payer: Self-pay | Admitting: Internal Medicine

## 2021-06-30 ENCOUNTER — Encounter: Payer: Self-pay | Admitting: Internal Medicine

## 2021-06-30 ENCOUNTER — Ambulatory Visit (INDEPENDENT_AMBULATORY_CARE_PROVIDER_SITE_OTHER): Payer: 59 | Admitting: Internal Medicine

## 2021-06-30 ENCOUNTER — Ambulatory Visit (INDEPENDENT_AMBULATORY_CARE_PROVIDER_SITE_OTHER): Payer: 59

## 2021-06-30 ENCOUNTER — Other Ambulatory Visit: Payer: Self-pay

## 2021-06-30 VITALS — BP 116/78 | HR 77 | Temp 98.2°F | Resp 16 | Ht 66.0 in | Wt 169.0 lb

## 2021-06-30 DIAGNOSIS — J22 Unspecified acute lower respiratory infection: Secondary | ICD-10-CM

## 2021-06-30 DIAGNOSIS — R058 Other specified cough: Secondary | ICD-10-CM

## 2021-06-30 LAB — POC COVID19 BINAXNOW: SARS Coronavirus 2 Ag: NEGATIVE

## 2021-06-30 MED ORDER — PROMETHAZINE-DM 6.25-15 MG/5ML PO SYRP
5.0000 mL | ORAL_SOLUTION | Freq: Four times a day (QID) | ORAL | 0 refills | Status: AC | PRN
Start: 2021-06-30 — End: 2021-07-07

## 2021-06-30 MED ORDER — AZITHROMYCIN 500 MG PO TABS
500.0000 mg | ORAL_TABLET | Freq: Every day | ORAL | 0 refills | Status: AC
Start: 2021-06-30 — End: 2021-07-03

## 2021-06-30 NOTE — Progress Notes (Signed)
Subjective:  Patient ID: Sabrina Huber, female    DOB: 11-May-2001  Age: 20 y.o. MRN: [id number]  CC: Cough  This visit occurred during the SARS-CoV-2 public health emergency.  Safety protocols were in place, including screening questions prior to the visit, additional usage of staff PPE, and extensive cleaning of exam room while observing appropriate contact time as indicated for disinfecting solutions.    HPI Sabrina Huber presents for f/up -   She complains of a 2-day history of sore throat and cough productive of thick yellow phlegm.  She denies odynophagia, lymphadenopathy, rash, fever, chills, shortness of breath, or night sweats.  History Sabrina Huber has no past medical history on file.   She has no past surgical history on file.   Her family history includes Breast cancer in her mother; Hyperlipidemia in her maternal uncle; Thyroid disease in her father.She reports that she has never smoked. She has never used smokeless tobacco. She reports current alcohol use of about 1.0 standard drink per week. She reports that she does not use drugs.  Outpatient Medications Prior to Visit  Medication Sig Dispense Refill   fluticasone (FLONASE) 50 MCG/ACT nasal spray Place 2 sprays into both nostrils daily. 16 g 0   Olopatadine HCl 0.2 % SOLN Apply 1-2 drops to eye daily. 5 mL 0   No facility-administered medications prior to visit.    ROS Review of Systems  Constitutional:  Negative for chills, diaphoresis, fatigue and fever.  HENT:  Positive for sore throat.   Eyes: Negative.   Respiratory:  Positive for cough. Negative for chest tightness, shortness of breath and wheezing.   Cardiovascular:  Negative for chest pain, palpitations and leg swelling.  Gastrointestinal:  Negative for abdominal pain, constipation, diarrhea, nausea and vomiting.  Endocrine: Negative.   Genitourinary: Negative.  Negative for difficulty urinating.  Musculoskeletal: Negative.   Skin: Negative.  Negative for  rash.  Neurological:  Negative for dizziness, weakness, light-headedness and headaches.  Hematological:  Negative for adenopathy. Does not bruise/bleed easily.  Psychiatric/Behavioral: Negative.     Objective:  BP 116/78 (BP Location: Right Arm, Patient Position: Sitting, Cuff Size: Normal)   Pulse 77   Temp 98.2 F (36.8 C) (Oral)   Resp 16   Ht 5\' 6"  (1.676 m)   Wt 169 lb (76.7 kg)   LMP 06/10/2021 (Exact Date)   SpO2 99%   BMI 27.28 kg/m   Physical Exam Vitals reviewed.  Constitutional:      General: She is not in acute distress.    Appearance: She is not ill-appearing or toxic-appearing.  HENT:     Nose: Nose normal.     Mouth/Throat:     Mouth: Mucous membranes are moist.  Eyes:     Conjunctiva/sclera: Conjunctivae normal.  Cardiovascular:     Rate and Rhythm: Normal rate and regular rhythm.     Heart sounds: No murmur heard. Pulmonary:     Effort: Pulmonary effort is normal.     Breath sounds: No stridor. No wheezing, rhonchi or rales.  Abdominal:     General: Abdomen is flat. Bowel sounds are normal. There is no distension.     Palpations: Abdomen is soft. There is no hepatomegaly, splenomegaly or mass.     Tenderness: There is no abdominal tenderness.  Musculoskeletal:        General: Normal range of motion.     Cervical back: Neck supple.     Right lower leg: No edema.     Left lower  leg: No edema.  Lymphadenopathy:     Cervical: No cervical adenopathy.  Skin:    General: Skin is warm and dry.  Neurological:     General: No focal deficit present.     Mental Status: She is alert.  Psychiatric:        Mood and Affect: Mood normal.        Behavior: Behavior normal.    Lab Results  Component Value Date   WBC 4.2 05/31/2021   HGB 12.4 05/31/2021   HCT 37.5 05/31/2021   PLT 237.0 05/31/2021   GLUCOSE 84 05/31/2021   CHOL 144 08/29/2017   TRIG 90.0 08/29/2017   HDL 39.50 08/29/2017   LDLCALC 87 08/29/2017   ALT 8 05/31/2021   AST 14 05/31/2021    NA 139 05/31/2021   K 3.7 05/31/2021   CL 106 05/31/2021   CREATININE 0.73 05/31/2021   BUN 10 05/31/2021   CO2 25 05/31/2021   TSH 0.86 05/31/2021   HGBA1C 5.4 08/29/2017     DG Chest 2 View  Result Date: 06/30/2021 CLINICAL DATA:  Cough 2 days EXAM: CHEST - 2 VIEW COMPARISON:  None. FINDINGS: The heart size and mediastinal contours are within normal limits. Both lungs are clear. The visualized skeletal structures are unremarkable. IMPRESSION: No active cardiopulmonary disease. Electronically Signed   By: Marlan Palau M.D.   On: 06/30/2021 16:16     Assessment & Plan:   Sabrina Huber was seen today for cough.  Diagnoses and all orders for this visit:  Cough productive of purulent sputum- Her chest x-ray is negative for mass or infiltrate. -     DG Chest 2 View; Future -     POC COVID-19 -     promethazine-dextromethorphan (PROMETHAZINE-DM) 6.25-15 MG/5ML syrup; Take 5 mLs by mouth 4 (four) times daily as needed for up to 7 days for cough.  LRTI (lower respiratory tract infection)- Will treat with azithromycin and offer symptom relief. -     azithromycin (ZITHROMAX) 500 MG tablet; Take 1 tablet (500 mg total) by mouth daily for 3 days. -     promethazine-dextromethorphan (PROMETHAZINE-DM) 6.25-15 MG/5ML syrup; Take 5 mLs by mouth 4 (four) times daily as needed for up to 7 days for cough.  I am having Sabrina Huber start on azithromycin and promethazine-dextromethorphan. I am also having her maintain her Olopatadine HCl and fluticasone.  Meds ordered this encounter  Medications   azithromycin (ZITHROMAX) 500 MG tablet    Sig: Take 1 tablet (500 mg total) by mouth daily for 3 days.    Dispense:  3 tablet    Refill:  0   promethazine-dextromethorphan (PROMETHAZINE-DM) 6.25-15 MG/5ML syrup    Sig: Take 5 mLs by mouth 4 (four) times daily as needed for up to 7 days for cough.    Dispense:  118 mL    Refill:  0      Follow-up: No follow-ups on file.  Sanda Linger, MD

## 2021-06-30 NOTE — Patient Instructions (Signed)

## 2022-08-16 IMAGING — DX DG CHEST 2V
2 series · 2 of 2 positions shown · non-contrast
Comparison: None.

CLINICAL DATA: Cough 2 days

EXAM:
CHEST - 2 VIEW

[chest pa]
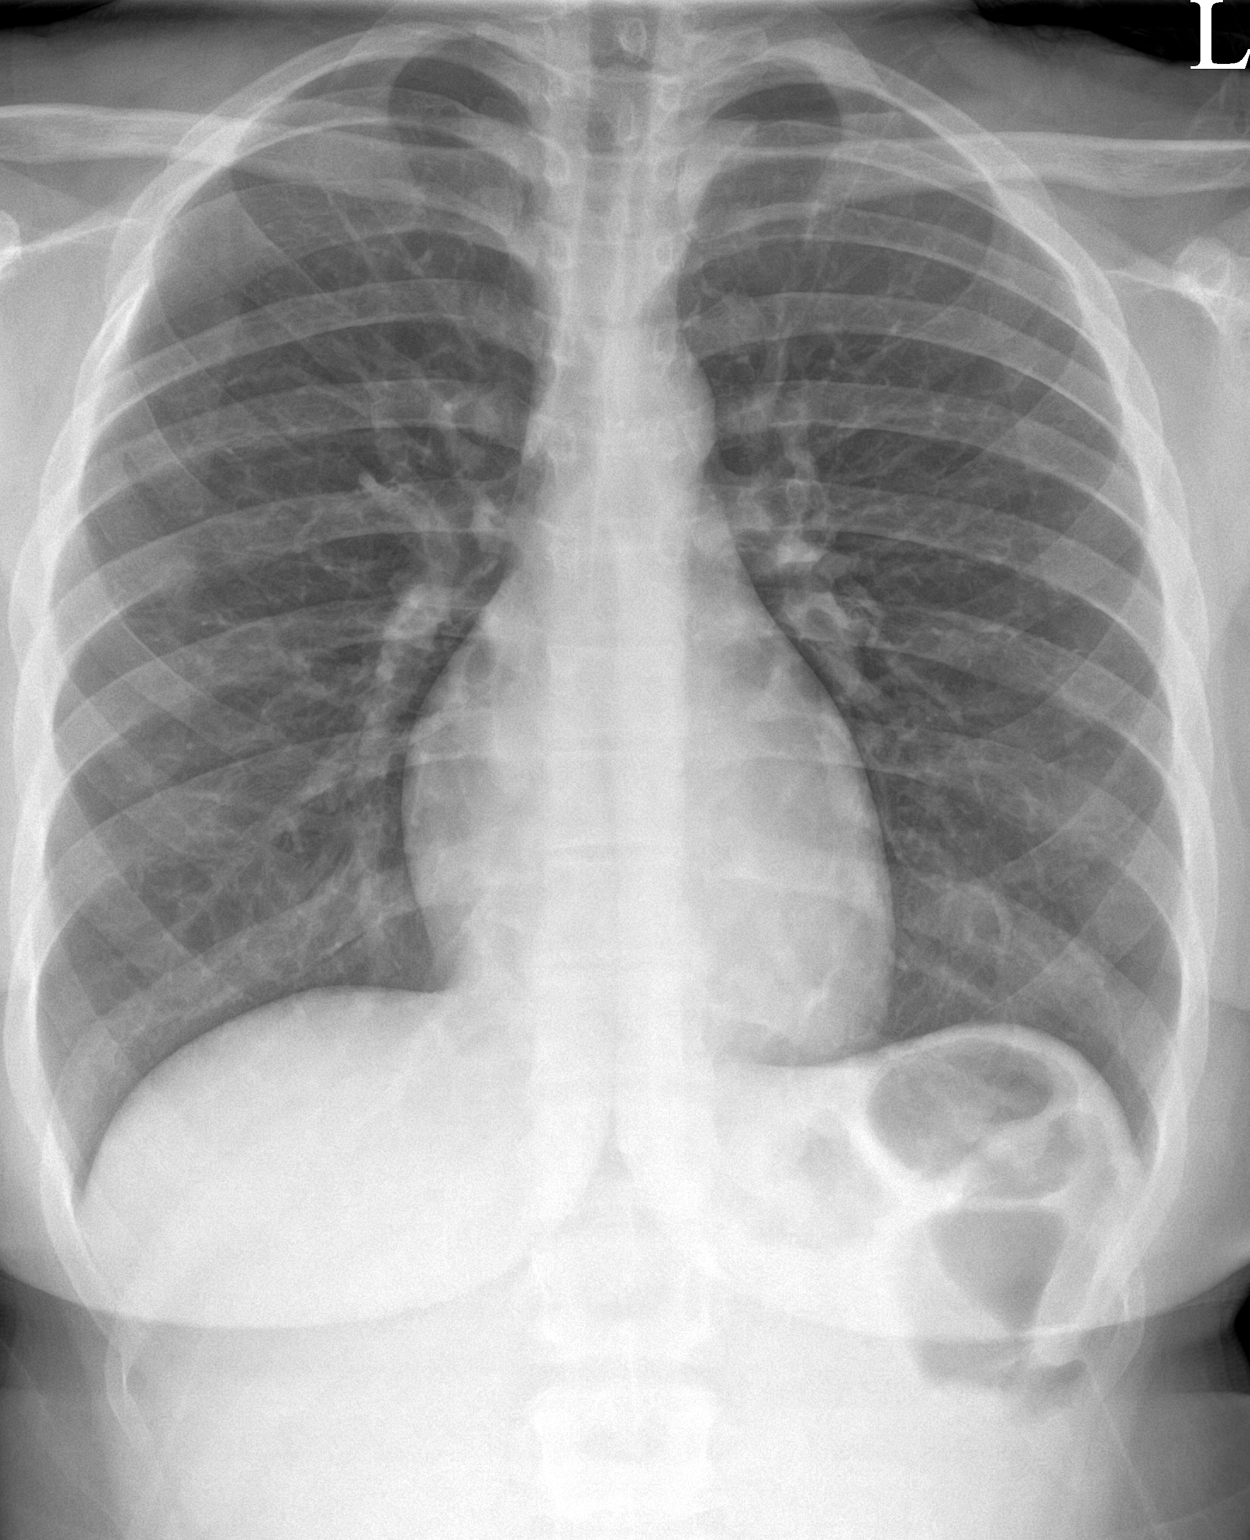

[chest lat]
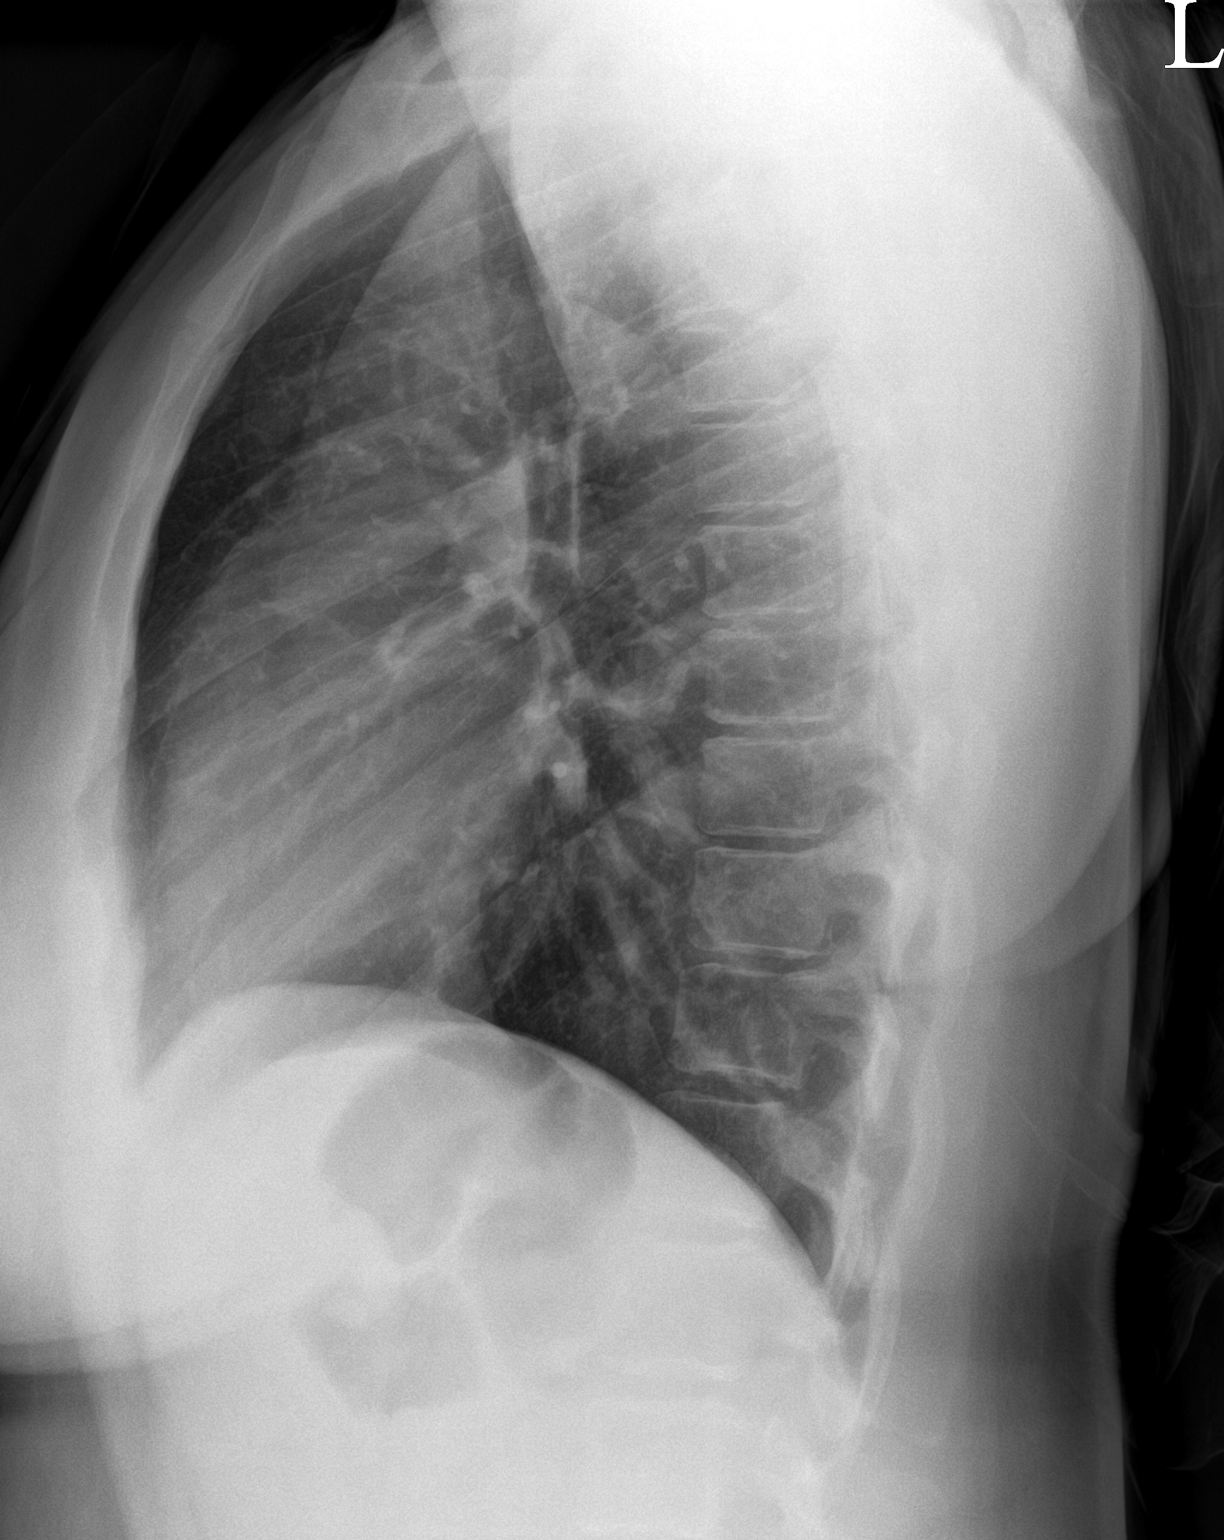

[2 of 2 positions shown; findings below may reference images not displayed]

FINDINGS: The heart size and mediastinal contours are within normal limits.
Both lungs are clear. The visualized skeletal structures are
unremarkable.
IMPRESSION: No active cardiopulmonary disease.

## 2023-08-12 ENCOUNTER — Encounter: Payer: Self-pay | Admitting: Internal Medicine

## 2023-09-20 ENCOUNTER — Ambulatory Visit: Payer: 59 | Admitting: Internal Medicine
